# Patient Record
Sex: Female | Born: 1995 | Race: White | Hispanic: No | Marital: Single | Smoking: Never smoker
Health system: Southern US, Community
[De-identification: ages and names within clinical notes are randomized; demographics above are authoritative.]

## PROBLEM LIST (undated history)

## (undated) DIAGNOSIS — R519 Headache, unspecified: Secondary | ICD-10-CM

## (undated) DIAGNOSIS — F32A Depression, unspecified: Secondary | ICD-10-CM

## (undated) DIAGNOSIS — D649 Anemia, unspecified: Secondary | ICD-10-CM

## (undated) DIAGNOSIS — F419 Anxiety disorder, unspecified: Secondary | ICD-10-CM

## (undated) DIAGNOSIS — E041 Nontoxic single thyroid nodule: Secondary | ICD-10-CM

## (undated) HISTORY — PX: OTHER SURGICAL HISTORY: SHX169

---

## 2019-03-22 ENCOUNTER — Encounter: Payer: Self-pay | Admitting: Gastroenterology

## 2019-03-24 ENCOUNTER — Encounter: Payer: Self-pay | Admitting: Gastroenterology

## 2019-03-30 ENCOUNTER — Telehealth: Payer: Self-pay

## 2019-03-30 NOTE — Telephone Encounter (Signed)
Ref by:  Barnett Applebaum, MD  [phone]    Thyroid mass    Please review and advise

## 2019-04-01 NOTE — Telephone Encounter (Signed)
ok 

## 2019-04-01 NOTE — Telephone Encounter (Signed)
Attempt 1 of 2: No answer, message left.

## 2019-04-02 NOTE — Telephone Encounter (Signed)
Attempt 2 of 2: No answer. The patient can call [phone] when she is ready to schedule.

## 2019-10-18 ENCOUNTER — Other Ambulatory Visit: Payer: Self-pay | Admitting: Otolaryngology

## 2019-10-18 DIAGNOSIS — E049 Nontoxic goiter, unspecified: Secondary | ICD-10-CM

## 2019-11-02 ENCOUNTER — Ambulatory Visit: Payer: Self-pay

## 2019-11-08 ENCOUNTER — Other Ambulatory Visit: Payer: Self-pay | Admitting: Otolaryngology

## 2019-11-08 DIAGNOSIS — E041 Nontoxic single thyroid nodule: Secondary | ICD-10-CM

## 2019-11-10 ENCOUNTER — Other Ambulatory Visit: Payer: Self-pay

## 2019-11-10 ENCOUNTER — Ambulatory Visit
Admission: RE | Admit: 2019-11-10 | Discharge: 2019-11-10 | Disposition: A | Discharge status: Home / Self Care | Payer: BC Managed Care – PPO | Source: Ambulatory Visit | Attending: Otolaryngology | Admitting: Otolaryngology

## 2019-11-10 DIAGNOSIS — E041 Nontoxic single thyroid nodule: Secondary | ICD-10-CM | POA: Diagnosis present

## 2019-11-26 ENCOUNTER — Encounter: Payer: Self-pay | Admitting: Advanced Practice Midwife

## 2019-12-24 ENCOUNTER — Encounter
Admission: RE | Admit: 2019-12-24 | Discharge: 2019-12-24 | Disposition: A | Discharge status: Home / Self Care | Payer: BC Managed Care – PPO | Source: Ambulatory Visit | Attending: Otolaryngology | Admitting: Otolaryngology

## 2019-12-24 ENCOUNTER — Other Ambulatory Visit: Payer: BC Managed Care – PPO

## 2019-12-24 DIAGNOSIS — Z01812 Encounter for preprocedural laboratory examination: Secondary | ICD-10-CM | POA: Insufficient documentation

## 2019-12-24 HISTORY — DX: Headache, unspecified: R51.9

## 2019-12-24 HISTORY — DX: Anxiety disorder, unspecified: F41.9

## 2019-12-24 HISTORY — DX: Depression, unspecified: F32.A

## 2019-12-24 HISTORY — DX: Nontoxic single thyroid nodule: E04.1

## 2019-12-24 HISTORY — DX: Anemia, unspecified: D64.9

## 2019-12-24 NOTE — Patient Instructions (Addendum)
Your procedure is scheduled on: 12/29/19- Wednesday Report to Day Surgery on the 2nd floor of the [facility]. To find out your arrival time, please call [phone] between 1PM - 3PM on: 12/28/19- Tuesday  REMEMBER: Instructions that are not followed completely may result in serious medical risk, up to and including death; or upon the discretion of your surgeon and anesthesiologist your surgery may need to be rescheduled.  Do not eat food after midnight the night before surgery.  No gum chewing, lozengers or hard candies.  You may however, drink CLEAR liquids up to 2 hours before you are scheduled to arrive for your surgery. Do not drink anything within 2 hours of your scheduled arrival time.  Clear liquids include: - water  - apple juice without pulp - gatorade (not RED, PURPLE, OR BLUE) - black coffee or tea (Do NOT add milk or creamers to the coffee or tea) Do NOT drink anything that is not on this list.  TAKE THESE MEDICATIONS THE MORNING OF SURGERY WITH A SIP OF WATER: N/A  One week prior to surgery: Stop Anti-inflammatories (NSAIDS) such as Advil, Aleve, Ibuprofen, Motrin, Naproxen, Naprosyn and Aspirin based products such as Excedrin, Goodys Powder, BC Powder.  Stop ANY OVER THE COUNTER supplements until after surgery. (You may continue taking Tylenol, Vitamin D, Vitamin B, and multivitamin.)  No Alcohol for 24 hours before or after surgery.  No Smoking including e-cigarettes for 24 hours prior to surgery.  No chewable tobacco products for at least 6 hours prior to surgery.  No nicotine patches on the day of surgery.  Do not use any "recreational" drugs for at least a week prior to your surgery.  Please be advised that the combination of cocaine and anesthesia may have negative outcomes, up to and including death. If you test positive for cocaine, your surgery will be cancelled.  On the morning of surgery brush your teeth with toothpaste and water, you may rinse your  mouth with mouthwash if you wish. Do not swallow any toothpaste or mouthwash.  Do not wear jewelry, make-up, hairpins, clips or nail polish.  Do not wear lotions, powders, or perfumes.   Do not shave 48 hours prior to surgery.   Contact lenses, hearing aids and dentures may not be worn into surgery.  Do not bring valuables to the hospital. [facility] is not responsible for any missing/lost belongings or valuables.   Use CHG Soap or wipes as directed on instruction sheet.  Notify your doctor if there is any change in your medical condition (cold, fever, infection).  Wear comfortable clothing (specific to your surgery type) to the hospital.  Plan for stool softeners for home use; pain medications have a tendency to cause constipation. You can also help prevent constipation by eating foods high in fiber such as fruits and vegetables and drinking plenty of fluids as your diet allows.  After surgery, you can help prevent lung complications by doing breathing exercises.  Take deep breaths and cough every 1-2 hours. Your doctor may order a device called an Incentive Spirometer to help you take deep breaths. When coughing or sneezing, hold a pillow firmly against your incision with both hands. This is called "splinting." Doing this helps protect your incision. It also decreases belly discomfort.  If you are being admitted to the hospital overnight, leave your suitcase in the car. After surgery it may be brought to your room.  If you are being discharged the day of surgery, you will not  be allowed to drive home. You will need a responsible adult (18 years or older) to drive you home and stay with you that night.   If you are taking public transportation, you will need to have a responsible adult (18 years or older) with you. Please confirm with your physician that it is acceptable to use public transportation.   Please call the Pre-admissions Testing Dept. at [phone] if you have any  questions about these instructions.  Visitation Policy:  Patients undergoing a surgery or procedure may have one family member or support person with them as long as that person is not COVID-19 positive or experiencing its symptoms.  That person may remain in the waiting area during the procedure.  Inpatient Visitation Update:   In an effort to ensure the safety of our team members and our patients, we are implementing a change to our visitation policy:  Effective Monday, Aug. 9, at 7 a.m., inpatients will be allowed one support person.  o The support person may change daily.  o The support person must pass our screening, gel in and out, and wear a mask at all times, including in the patient's room.  o Patients must also wear a mask when staff or their support person are in the room.  o Masking is required regardless of vaccination status.  Systemwide, no visitors 17 or younger.

## 2019-12-27 ENCOUNTER — Other Ambulatory Visit
Admission: RE | Admit: 2019-12-27 | Discharge: 2019-12-27 | Disposition: A | Discharge status: Home / Self Care | Payer: BC Managed Care – PPO | Source: Ambulatory Visit | Attending: Otolaryngology | Admitting: Otolaryngology

## 2019-12-27 ENCOUNTER — Other Ambulatory Visit: Payer: Self-pay

## 2019-12-27 DIAGNOSIS — Z01812 Encounter for preprocedural laboratory examination: Secondary | ICD-10-CM | POA: Diagnosis not present

## 2019-12-27 DIAGNOSIS — Z20822 Contact with and (suspected) exposure to covid-19: Secondary | ICD-10-CM | POA: Diagnosis not present

## 2019-12-28 LAB — SARS CORONAVIRUS 2 (TAT 6-24 HRS): SARS Coronavirus 2: NEGATIVE

## 2019-12-28 MED ORDER — ORAL CARE MOUTH RINSE: 15.0000 mL | Freq: Once | OROMUCOSAL | Status: AC

## 2019-12-28 MED ORDER — CHLORHEXIDINE GLUCONATE 0.12 % MT SOLN: 15.0000 mL | Freq: Once | OROMUCOSAL | Status: AC

## 2019-12-28 MED ORDER — LACTATED RINGERS IV SOLN: INTRAVENOUS | Status: DC

## 2019-12-28 MED ORDER — FAMOTIDINE 20 MG PO TABS: 20.0000 mg | ORAL_TABLET | Freq: Once | ORAL | Status: AC

## 2019-12-29 ENCOUNTER — Ambulatory Visit: Payer: BC Managed Care – PPO

## 2019-12-29 ENCOUNTER — Observation Stay
Admission: RE | Admit: 2019-12-29 | Discharge: 2019-12-29 | Disposition: A | Discharge status: Home / Self Care | Payer: BC Managed Care – PPO | Attending: Otolaryngology | Admitting: Otolaryngology

## 2019-12-29 ENCOUNTER — Other Ambulatory Visit: Payer: Self-pay

## 2019-12-29 ENCOUNTER — Encounter: Payer: Self-pay | Admitting: Otolaryngology

## 2019-12-29 ENCOUNTER — Encounter
Admission: RE | Disposition: A | Discharge status: Home / Self Care | Payer: Self-pay | Source: Home / Self Care | Attending: Otolaryngology

## 2019-12-29 DIAGNOSIS — E890 Postprocedural hypothyroidism: Secondary | ICD-10-CM

## 2019-12-29 DIAGNOSIS — E041 Nontoxic single thyroid nodule: Principal | ICD-10-CM | POA: Insufficient documentation

## 2019-12-29 HISTORY — PX: THYROIDECTOMY: SHX17

## 2019-12-29 LAB — POCT PREGNANCY, URINE: Preg Test, Ur: NEGATIVE

## 2019-12-29 SURGERY — THYROIDECTOMY
Anesthesia: General | Site: Neck | Laterality: Right

## 2019-12-29 MED ORDER — ONDANSETRON 4 MG PO TBDP: 4.0000 mg | ORAL_TABLET | Freq: Four times a day (QID) | ORAL | Status: DC | PRN

## 2019-12-29 MED ORDER — FENTANYL CITRATE (PF) 100 MCG/2ML IJ SOLN
INTRAMUSCULAR | Status: AC
  Filled 2019-12-29: qty 2

## 2019-12-29 MED ORDER — SUCCINYLCHOLINE CHLORIDE 20 MG/ML IJ SOLN
INTRAMUSCULAR | Status: DC | PRN
  Administered 2019-12-29: 100 mg via INTRAVENOUS

## 2019-12-29 MED ORDER — SEVOFLURANE IN SOLN
RESPIRATORY_TRACT | Status: AC
  Filled 2019-12-29: qty 250

## 2019-12-29 MED ORDER — FLEET ENEMA 7-19 GM/118ML RE ENEM: 1.0000 | ENEMA | Freq: Once | RECTAL | Status: DC | PRN

## 2019-12-29 MED ORDER — SCOPOLAMINE 1 MG/3DAYS TD PT72: 1.0000 | MEDICATED_PATCH | TRANSDERMAL | Status: DC

## 2019-12-29 MED ORDER — CHLORHEXIDINE GLUCONATE 0.12 % MT SOLN
OROMUCOSAL | Status: AC
  Administered 2019-12-29: 15 mL via OROMUCOSAL
  Filled 2019-12-29: qty 15

## 2019-12-29 MED ORDER — DEXAMETHASONE SODIUM PHOSPHATE 10 MG/ML IJ SOLN
INTRAMUSCULAR | Status: DC | PRN
  Administered 2019-12-29: 10 mg via INTRAVENOUS

## 2019-12-29 MED ORDER — DEXMEDETOMIDINE (PRECEDEX) IN NS 20 MCG/5ML (4 MCG/ML) IV SYRINGE
PREFILLED_SYRINGE | INTRAVENOUS | Status: DC | PRN
  Administered 2019-12-29: 8 ug via INTRAVENOUS
  Administered 2019-12-29 (×3): 4 ug via INTRAVENOUS

## 2019-12-29 MED ORDER — SODIUM CHLORIDE 0.9 % IV SOLN
INTRAVENOUS | Status: DC | PRN
  Administered 2019-12-29: 30 ug/min via INTRAVENOUS

## 2019-12-29 MED ORDER — SUCCINYLCHOLINE CHLORIDE 200 MG/10ML IV SOSY
PREFILLED_SYRINGE | INTRAVENOUS | Status: AC
  Filled 2019-12-29: qty 10

## 2019-12-29 MED ORDER — DEXAMETHASONE SODIUM PHOSPHATE 10 MG/ML IJ SOLN
INTRAMUSCULAR | Status: AC
  Filled 2019-12-29: qty 1

## 2019-12-29 MED ORDER — ROCURONIUM BROMIDE 100 MG/10ML IV SOLN
INTRAVENOUS | Status: DC | PRN
  Administered 2019-12-29: 5 mg via INTRAVENOUS

## 2019-12-29 MED ORDER — FENTANYL CITRATE (PF) 100 MCG/2ML IJ SOLN
INTRAMUSCULAR | Status: DC | PRN
  Administered 2019-12-29 (×3): 50 ug via INTRAVENOUS

## 2019-12-29 MED ORDER — KETAMINE HCL 50 MG/ML IJ SOLN
INTRAMUSCULAR | Status: AC
  Filled 2019-12-29: qty 10

## 2019-12-29 MED ORDER — PROPOFOL 10 MG/ML IV BOLUS
INTRAVENOUS | Status: AC
  Filled 2019-12-29: qty 40

## 2019-12-29 MED ORDER — LIDOCAINE-EPINEPHRINE (PF) 1 %-1:200000 IJ SOLN
INTRAMUSCULAR | Status: DC | PRN
  Administered 2019-12-29: 6 mL

## 2019-12-29 MED ORDER — LIDOCAINE HCL (CARDIAC) PF 100 MG/5ML IV SOSY
PREFILLED_SYRINGE | INTRAVENOUS | Status: DC | PRN
  Administered 2019-12-29: 20 mg via INTRAVENOUS
  Administered 2019-12-29: 80 mg via INTRAVENOUS

## 2019-12-29 MED ORDER — ONDANSETRON HCL 4 MG/2ML IJ SOLN: 4.0000 mg | Freq: Once | INTRAMUSCULAR | Status: DC | PRN

## 2019-12-29 MED ORDER — HYDROCODONE-ACETAMINOPHEN 5-325 MG PO TABS
1.0000 | ORAL_TABLET | ORAL | 0 refills | Status: AC | PRN
Start: 2019-12-29 — End: 2020-01-05

## 2019-12-29 MED ORDER — BISACODYL 5 MG PO TBEC
5.0000 mg | DELAYED_RELEASE_TABLET | Freq: Every day | ORAL | Status: DC | PRN
  Filled 2019-12-29: qty 1

## 2019-12-29 MED ORDER — KETAMINE HCL 10 MG/ML IJ SOLN
INTRAMUSCULAR | Status: DC | PRN
  Administered 2019-12-29: 30 mg via INTRAVENOUS

## 2019-12-29 MED ORDER — HYDROCODONE-ACETAMINOPHEN 5-325 MG PO TABS: 1.0000 | ORAL_TABLET | ORAL | Status: DC | PRN

## 2019-12-29 MED ORDER — FENTANYL CITRATE (PF) 100 MCG/2ML IJ SOLN
INTRAMUSCULAR | Status: AC
  Administered 2019-12-29: 25 ug via INTRAVENOUS
  Filled 2019-12-29: qty 2

## 2019-12-29 MED ORDER — PROPOFOL 10 MG/ML IV BOLUS
INTRAVENOUS | Status: DC | PRN
  Administered 2019-12-29: 150 mg via INTRAVENOUS
  Administered 2019-12-29: 30 mg via INTRAVENOUS

## 2019-12-29 MED ORDER — LIDOCAINE HCL (PF) 2 % IJ SOLN
INTRAMUSCULAR | Status: AC
  Filled 2019-12-29: qty 5

## 2019-12-29 MED ORDER — PHENYLEPHRINE HCL (PRESSORS) 10 MG/ML IV SOLN
INTRAVENOUS | Status: AC
  Filled 2019-12-29: qty 1

## 2019-12-29 MED ORDER — ONDANSETRON HCL 4 MG/2ML IJ SOLN
INTRAMUSCULAR | Status: AC
  Filled 2019-12-29: qty 2

## 2019-12-29 MED ORDER — MAGNESIUM HYDROXIDE 400 MG/5ML PO SUSP
30.0000 mL | Freq: Every day | ORAL | Status: DC | PRN
  Filled 2019-12-29: qty 30

## 2019-12-29 MED ORDER — MIDAZOLAM HCL 2 MG/2ML IJ SOLN
INTRAMUSCULAR | Status: AC
  Filled 2019-12-29: qty 4

## 2019-12-29 MED ORDER — SCOPOLAMINE 1 MG/3DAYS TD PT72
MEDICATED_PATCH | TRANSDERMAL | Status: AC
  Administered 2019-12-29: 1.5 mg via TRANSDERMAL
  Filled 2019-12-29: qty 1

## 2019-12-29 MED ORDER — FENTANYL CITRATE (PF) 100 MCG/2ML IJ SOLN
25.0000 ug | INTRAMUSCULAR | Status: DC | PRN
  Administered 2019-12-29 (×3): 25 ug via INTRAVENOUS

## 2019-12-29 MED ORDER — DEXMEDETOMIDINE (PRECEDEX) IN NS 20 MCG/5ML (4 MCG/ML) IV SYRINGE
PREFILLED_SYRINGE | INTRAVENOUS | Status: AC
  Filled 2019-12-29: qty 5

## 2019-12-29 MED ORDER — BACITRACIN 500 UNIT/GM EX OINT
TOPICAL_OINTMENT | CUTANEOUS | Status: DC | PRN
  Administered 2019-12-29: 1 via TOPICAL

## 2019-12-29 MED ORDER — DEXTROSE-NACL 5-0.45 % IV SOLN: INTRAVENOUS | Status: DC

## 2019-12-29 MED ORDER — FAMOTIDINE 20 MG PO TABS
ORAL_TABLET | ORAL | Status: AC
  Administered 2019-12-29: 20 mg via ORAL
  Filled 2019-12-29: qty 1

## 2019-12-29 MED ORDER — MIDAZOLAM HCL 2 MG/2ML IJ SOLN
INTRAMUSCULAR | Status: DC | PRN
  Administered 2019-12-29: 1 mg via INTRAVENOUS
  Administered 2019-12-29: 3 mg via INTRAVENOUS

## 2019-12-29 MED ORDER — MORPHINE SULFATE (PF) 4 MG/ML IV SOLN: 3.0000 mg | INTRAVENOUS | Status: DC | PRN

## 2019-12-29 MED ORDER — PHENYLEPHRINE HCL (PRESSORS) 10 MG/ML IV SOLN
INTRAVENOUS | Status: DC | PRN
  Administered 2019-12-29: 50 ug via INTRAVENOUS
  Administered 2019-12-29 (×4): 100 ug via INTRAVENOUS

## 2019-12-29 MED ORDER — HYDROCODONE-ACETAMINOPHEN 5-325 MG PO TABS
ORAL_TABLET | ORAL | Status: AC
  Administered 2019-12-29: 2 via ORAL
  Filled 2019-12-29: qty 2

## 2019-12-29 MED ORDER — ONDANSETRON HCL 4 MG/2ML IJ SOLN
INTRAMUSCULAR | Status: DC | PRN
  Administered 2019-12-29: 4 mg via INTRAVENOUS

## 2019-12-29 MED ORDER — ONDANSETRON HCL 4 MG/2ML IJ SOLN: 4.0000 mg | Freq: Four times a day (QID) | INTRAMUSCULAR | Status: DC | PRN

## 2019-12-29 SURGICAL SUPPLY — 44 items
BLADE SURG 15 STRL LF DISP TIS (BLADE) ×2 IMPLANT
BLADE SURG 15 STRL SS (BLADE) ×2
BULB RESERV EVAC DRAIN JP 100C (MISCELLANEOUS) IMPLANT
CANISTER SUCT 1200ML W/VALVE (MISCELLANEOUS) ×2 IMPLANT
CORD BIP STRL DISP 12FT (MISCELLANEOUS) ×2 IMPLANT
COVER WAND RF STERILE (DRAPES) ×2 IMPLANT
DRAIN TLS ROUND 10FR (DRAIN) IMPLANT
DRAPE MAG INST 16X20 L/F (DRAPES) ×2 IMPLANT
DRSG TEGADERM 2-3/8X2-3/4 SM (GAUZE/BANDAGES/DRESSINGS) ×2 IMPLANT
DRSG TEGADERM 4X4.75 (GAUZE/BANDAGES/DRESSINGS) ×2 IMPLANT
DRSG TELFA 3X8 NADH (GAUZE/BANDAGES/DRESSINGS) ×2 IMPLANT
ELECT LARYNGEAL 6/7 (MISCELLANEOUS)
ELECT LARYNGEAL 8/9 (MISCELLANEOUS)
ELECT NEEDLE 20X.3 GREEN (MISCELLANEOUS) ×2
ELECT REM PT RETURN 9FT ADLT (ELECTROSURGICAL) ×2
ELECTRODE LARYNGEAL 6/7 (MISCELLANEOUS) IMPLANT
ELECTRODE LARYNGEAL 8/9 (MISCELLANEOUS) IMPLANT
ELECTRODE NDL 20X.3 GREEN (MISCELLANEOUS) IMPLANT
ELECTRODE NEEDLE 20X.3 GREEN (MISCELLANEOUS) ×1 IMPLANT
ELECTRODE REM PT RTRN 9FT ADLT (ELECTROSURGICAL) ×1 IMPLANT
FORCEPS JEWEL BIP 4-3/4 STR (INSTRUMENTS) ×2 IMPLANT
GAUZE 4X4 16PLY RFD (DISPOSABLE) ×2 IMPLANT
GLOVE BIO SURGEON STRL SZ7.5 (GLOVE) ×2 IMPLANT
GLOVE PROTEXIS LATEX SZ 7.5 (GLOVE) ×2 IMPLANT
GLOVE SURG LATEX 7.5 PF (GLOVE) ×1 IMPLANT
GOWN STRL REUS W/ TWL LRG LVL3 (GOWN DISPOSABLE) ×3 IMPLANT
GOWN STRL REUS W/TWL LRG LVL3 (GOWN DISPOSABLE) ×3
HEMOSTAT SURGICEL 2X3 (HEMOSTASIS) ×2 IMPLANT
HOOK STAY BLUNT/RETRACTOR 5M (MISCELLANEOUS) ×2 IMPLANT
LABEL OR SOLS (LABEL) ×2 IMPLANT
NS IRRIG 500ML POUR BTL (IV SOLUTION) ×2 IMPLANT
PACK HEAD/NECK (MISCELLANEOUS) ×2 IMPLANT
PAD DRESSING TELFA 3X8 NADH (GAUZE/BANDAGES/DRESSINGS) ×1 IMPLANT
PROBE NEUROSIGN BIPOL (MISCELLANEOUS) ×1 IMPLANT
PROBE NEUROSIGN BIPOLAR (MISCELLANEOUS) ×1
SHEARS HARMONIC 9CM CVD (BLADE) ×2 IMPLANT
SPONGE KITTNER 5P (MISCELLANEOUS) ×2 IMPLANT
STRAP SAFETY 5IN WIDE (MISCELLANEOUS) ×2 IMPLANT
SUT ETHILON 6 0 9-3 1X18 BLK (SUTURE) IMPLANT
SUT PROLENE 6 0 P 1 18 (SUTURE) ×2 IMPLANT
SUT SILK 2 0 (SUTURE) ×1
SUT SILK 2-0 18XBRD TIE 12 (SUTURE) ×1 IMPLANT
SUT VIC AB 4-0 RB1 18 (SUTURE) ×3 IMPLANT
SYSTEM CHEST DRAIN TLS 7FR (DRAIN) ×1 IMPLANT

## 2019-12-29 NOTE — Anesthesia Procedure Notes (Signed)
Procedure Name: Intubation Date/Time: 12/29/2019 8:43 AM Performed by: Elmarie Mainland, CRNA Pre-anesthesia Checklist: Patient identified, Emergency Drugs available, Suction available and Patient being monitored Patient Re-evaluated:Patient Re-evaluated prior to induction Oxygen Delivery Method: Circle system utilized Preoxygenation: Pre-oxygenation with 100% oxygen Induction Type: IV induction Ventilation: Mask ventilation without difficulty Laryngoscope Size: McGraph and 3 Grade View: Grade I Tube type: Oral (with neuro monitor device) Number of attempts: 1 Airway Equipment and Method: Stylet and Video-laryngoscopy Placement Confirmation: ETT inserted through vocal cords under direct vision,  positive ETCO2 and breath sounds checked- equal and bilateral Secured at: 21 cm Tube secured with: Tape Dental Injury: Teeth and Oropharynx as per pre-operative assessment

## 2019-12-29 NOTE — Anesthesia Preprocedure Evaluation (Signed)
Anesthesia Evaluation  Patient identified by MRN, date of birth, ID band Patient awake    Reviewed: Allergy & Precautions, NPO status , Patient's Chart, lab work & pertinent test results  Airway Mallampati: II  TM Distance: >3 FB     Dental  (+) Teeth Intact   Pulmonary neg pulmonary ROS,    Pulmonary exam normal        Cardiovascular negative cardio ROS Normal cardiovascular exam     Neuro/Psych  Headaches, PSYCHIATRIC DISORDERS Anxiety Depression    GI/Hepatic negative GI ROS, Neg liver ROS,   Endo/Other  negative endocrine ROS  Renal/GU negative Renal ROS  negative genitourinary   Musculoskeletal negative musculoskeletal ROS (+)   Abdominal Normal abdominal exam  (+)   Peds negative pediatric ROS (+)  Hematology  (+) anemia ,   Anesthesia Other Findings Past Medical History: No date: Anemia No date: Anxiety No date: Depression No date: Headache No date: Thyroid cyst  Reproductive/Obstetrics                             Anesthesia Physical Anesthesia Plan  ASA: II  Anesthesia Plan: General   Post-op Pain Management:    Induction: Intravenous  PONV Risk Score and Plan:   Airway Management Planned: Oral ETT  Additional Equipment:   Intra-op Plan:   Post-operative Plan: Extubation in OR  Informed Consent: I have reviewed the patients History and Physical, chart, labs and discussed the procedure including the risks, benefits and alternatives for the proposed anesthesia with the patient or authorized representative who has indicated his/her understanding and acceptance.     Dental advisory given  Plan Discussed with: CRNA and Surgeon  Anesthesia Plan Comments:         Anesthesia Quick Evaluation

## 2019-12-29 NOTE — Progress Notes (Signed)
Discharge instructions given to patient at bedside. Information explained to patient and patient verbalized understanding. No further questions or concerns at this time.   Luvenia Starch, RN

## 2019-12-29 NOTE — Transfer of Care (Signed)
Immediate Anesthesia Transfer of Care Note  Patient: Cynthia [name]  Procedure(s) Performed: RIGHT THYROIDECTOMY (Right Neck)  Patient Location: PACU  Anesthesia Type:General  Level of Consciousness: drowsy  Airway & Oxygen Therapy: Patient Spontanous Breathing and Patient connected to face mask oxygen  Post-op Assessment: Report given to RN and Post -op Vital signs reviewed and stable  Post vital signs: Reviewed and stable  Last Vitals:  Vitals Value Taken Time  BP 111/62 12/29/19 1048  Temp 36.4 C 12/29/19 1048  Pulse 72 12/29/19 1051  Resp 13 12/29/19 1051  SpO2 100 % 12/29/19 1051  Vitals shown include unvalidated device data.  Last Pain:  Vitals:   12/29/19 1048  TempSrc:   PainSc: Asleep         Complications: No complications documented.

## 2019-12-29 NOTE — Progress Notes (Signed)
Patient has been advanced from soft foods to regular diet per order and tolerating well. Patient assisted with walking and performed well. Up as tolerated. Drain output currently at 2 cc's. MD notified.   Luvenia Starch, RN

## 2019-12-29 NOTE — Op Note (Signed)
12/29/2019  10:43 AM    Cynthia [name]  [id number]   Pre-Op Dx: Massive right thyroid cyst  Post-op Dx: Same  Proc: Right hemithyroidectomy with removal of thyroid cyst  Surg:  Cammy Copa     assessed: Cynthia [name]  Anes:  GOT  EBL: 50 mL  Comp: None  Findings: Very large cyst approximately 8 cm in size that was attached to the right thyroid gland.  The right recurrent laryngeal nerve was found and stimulated well at the end of the case.  The right superior parathyroid gland was found and spared as well.  Procedure: Patient was brought to the operating room and placed in the supine position.  The patient was given general anesthesia by oral endotracheal intubation.  The endotracheal tube was wrapped with electrodes to continuously monitor the recurrent laryngeal nerves during the procedure.  The neck had swollen area on the right side.  There was a scar on the anterior neck where a small biopsy of the skin was done previously.  The neck was marked with the curvilinear incision that was around the previous scar to remove the scar.  7 mL of 1% Xylocaine with epi 1-100,000 was used for infiltration of the subcu tissues.  The patient was then prepped and draped in a sterile fashion.  The skin was then excised creating a smile shaped incision that envelope the scar tissue that was in the right anterior neck.  The skin was then elevated superiorly and inferiorly.  The platysma muscle was then cut across and bleeding was controlled with electrocautery.  The strap muscles were divided in the midline and elevated over the large cyst.  The cyst extended across the midline but was not attached to the left thyroid gland.  The cyst was freed up on the left side first to separated from the tissues and the trachea was palpable.  The attachment of the trachea were left for the time being.  The attachments inferiorly and laterally were freed up from the cyst.  It was scarred to the right of midline  where previous aspirations of the cyst were done.  The cyst was still intact at the end of the procedure.  The cyst was freed up around the superior right pole and the superior portion of the thyroid gland was found.  This was freed up from its attachments on both sides and the superior thyroid vessels were cut across with the harmonic scalpel.  A superior parathyroid gland was found in the left buried in the superior tissues.  Once the superior pole of the thyroid gland was freed up it was pulled medially.  The large cyst was able to be delivered through the wound so that the entire gland was rotated from lateral to medial along with the cyst.  The recurrent laryngeal nerve was found and stimulated well.  It was tracked up and the fascia of Berry's ligament was cut across to free up the gland from the trachea and expose the nerve as it turned and went into the larynx.  The nerve was spared as the remaining attachments of the gland to the trachea were freed up.  There is a very small isthmus that was cut across.  Once the gland and cyst was completely removed it was sent for permanent section.  The nerve was restimulated and stimulated well.  The wound was irrigated and there was no significant bleeding at all.  It was a very dry wound.  Some Surgicel was placed  over the nerve bed and a 7 French TLS drain was placed.  The drain was placed to Vacutainer suction.  The wound was then closed.  4-0 Vicryl's were used to bring the strap muscle to the midline and close them loosely.  The platysma muscle was then closed with interrupted 4-0 Vicryl sutures.  The skin edges were elevated on both sides for advancement closure.  The dermis was then closed with 4-0 interrupted Vicryls and the skin edges were held in apposition with a running locking 6-0 Prolene suture.  The wound was then covered with a small amount of ointment followed by Telfa and a Tegaderm dressing.  The drains were taped in with 2 inch paper tape.  The  patient tolerated the procedure well.  She was awakened and taken to recovery room in satisfactory condition.  There were no operative complications.  Dispo:   To PACU to the observed for the afternoon and evening, and possibly overnight.  Plan: We will check this evening and if she is doing well we can pull the drain and discharge her home tonight.  Beverly Sessions Stori Royse  12/29/2019 10:43 AM

## 2019-12-29 NOTE — Progress Notes (Signed)
Received report from [place], RN at bedside. Patient vitals stable. Up as tolerated. Will continue to monitor.  Luvenia Starch, RN

## 2019-12-29 NOTE — Progress Notes (Signed)
12/29/2019 6:12 PM  [name], [name] [id number]  Post-Op Check    Temp:  [97.2 F (36.2 C)-98.5 F (36.9 C)] 98.4 F (36.9 C) (11/03 1703) Pulse Rate:  [70-102] 73 (11/03 1703) Resp:  [12-27] 20 (11/03 1703) BP: (99-125)/(62-79) 115/79 (11/03 1703) SpO2:  [98 %-100 %] 100 % (11/03 1703) Weight:  [77.1 kg] 77.1 kg (11/03 0720),     Intake/Output Summary (Last 24 hours) at 12/29/2019 1812 Last data filed at 12/29/2019 1500 Gross per 24 hour  Intake 1782.05 ml  Output 22 ml  Net 1760.05 ml    Results for orders placed or performed during the hospital encounter of 12/29/19 (from the past 24 hour(s))  Pregnancy, urine POC     Status: None   Collection Time: 12/29/19  7:40 AM  Result Value Ref Range   Preg Test, Ur NEGATIVE NEGATIVE    SUBJECTIVE: She is feeling very well with no significant pain at all.  She has not needed any pain medication thus far.  She is eating well without any pain.  Her voice is clear.  OBJECTIVE: Her wound is flat and the dressing is completely dry.  She is only had 2 mL of drainage out for the entire afternoon.  I remove the drain now.  Her dressing is intact.  IMPRESSION: She is about 7 hours postop right hemithyroidectomy with removal of a large cyst.  She is not having any problems and seems to be ready for discharge.  Her pain is well controlled.  She is not had any bleeding and her dressing is dry.  PLAN: I have written for her discharge now.  We will send some Vicodin to her pharmacy that she can use for pain at home and when is not bothering her much can use Tylenol or ibuprofen in a couple of days.  We will follow up in the office in 1 week for removal of sutures and to go over the path report.  She will rest at home with her head elevated some for the next couple of days.  Cynthia [name] 12/29/2019, 6:12 PM

## 2019-12-29 NOTE — H&P (Signed)
H&P has been reviewed and patient reevaluated, no changes necessary. To be downloaded later.  

## 2019-12-30 ENCOUNTER — Encounter: Payer: Self-pay | Admitting: Otolaryngology

## 2019-12-30 LAB — SURGICAL PATHOLOGY

## 2019-12-30 NOTE — Anesthesia Postprocedure Evaluation (Signed)
Anesthesia Post Note  Patient: Cynthia [name]  Procedure(s) Performed: RIGHT THYROIDECTOMY (Right Neck)  Patient location during evaluation: Endoscopy Anesthesia Type: General Level of consciousness: awake and alert and oriented Pain management: pain level controlled Vital Signs Assessment: post-procedure vital signs reviewed and stable Respiratory status: spontaneous breathing Cardiovascular status: blood pressure returned to baseline Anesthetic complications: no   No complications documented.   Last Vitals:  Vitals:   12/29/19 1703 12/29/19 1704  BP: 115/79 115/79  Pulse: 73 67  Resp: 20   Temp: 36.9 C 36.9 C  SpO2: 100% 100%    Last Pain:  Vitals:   12/29/19 1704  TempSrc: Oral  PainSc:                  [name]

## 2020-01-06 NOTE — Discharge Summary (Signed)
Physician Discharge Summary  Patient ID: Cynthia [name] MRN: [id number] DOB/AGE: 24-14-1997 24 y.o.  Admit date: 12/29/2019 Discharge date: 01/06/2020  Admission Diagnoses: Right thyroid cyst  Discharge Diagnoses: Same Active Problems:   S/P partial thyroidectomy   Discharged Condition: Good  Hospital Course: Had right thyroidectomy. Drains with almost no drainage, and removed 6 hours postop. Voice clear. Eating well. D/C'd home.  Consults: None  Significant Diagnostic Studies: None  Treatments: Right hemithyroidectomy  Discharge Exam: Blood pressure 115/79, pulse 67, temperature 98.4 F (36.9 C), temperature source Oral, resp. rate 20, height 5\' 7"  (1.702 m), weight 77.1 kg, last menstrual period 12/15/2019, SpO2 100 %. Wound flat and dressing dry.  Disposition: Discharge disposition: 01-Home or Self Care       Discharge Instructions    Call MD for:  hives   Complete by: As directed    Call MD for:  persistant nausea and vomiting   Complete by: As directed    Call MD for:  severe uncontrolled pain   Complete by: As directed    Diet general   Complete by: As directed    Discharge wound care:   Complete by: As directed    Keep dressing on wound until seen in office. May changer at home if she wishes.   Increase activity slowly   Complete by: As directed      Allergies as of 12/29/2019   No Known Allergies     Medication List    TAKE these medications   drospirenone-ethinyl estradiol 3-0.03 MG tablet Commonly known as: YASMIN Take 1 tablet by mouth daily.     ASK your doctor about these medications   HYDROcodone-acetaminophen 5-325 MG tablet Commonly known as: NORCO/VICODIN Take 1-2 tablets by mouth every 4 (four) hours as needed for up to 7 days for moderate pain. Ask about: Should I take this medication?            Discharge Care Instructions  (From admission, onward)         Start     Ordered   12/29/19 0000  Discharge wound care:        Comments: Keep dressing on wound until seen in office. May changer at home if she wishes.   12/29/19 1807          Follow-up Information    13/03/21, MD In 1 week.   Specialty: Otolaryngology Contact information: [address] [place] [phone]               Signed: [phone] 01/06/2020, 7:28 AM

## 2020-04-10 ENCOUNTER — Other Ambulatory Visit (HOSPITAL_COMMUNITY): Payer: Self-pay | Admitting: Otolaryngology

## 2022-08-14 IMAGING — US US THYROID
1 series · 13 of 25 positions shown · non-contrast
Comparison: None.

CLINICAL DATA: Thyroid nodule on exam

EXAM:
THYROID ULTRASOUND
TECHNIQUE: Ultrasound examination of the thyroid gland and adjacent soft
tissues was performed.

[Series 1: us thyroid · 0.07mm/px · 13 of 74 slices shown]
[im 1/74]
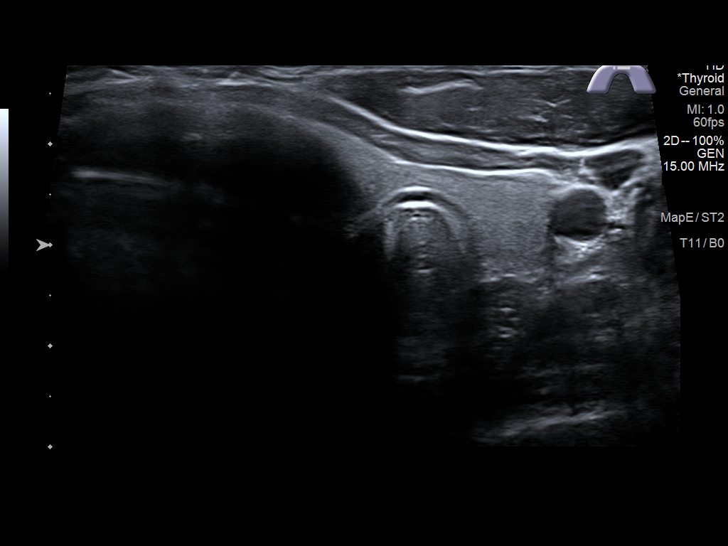
[im 7/74]
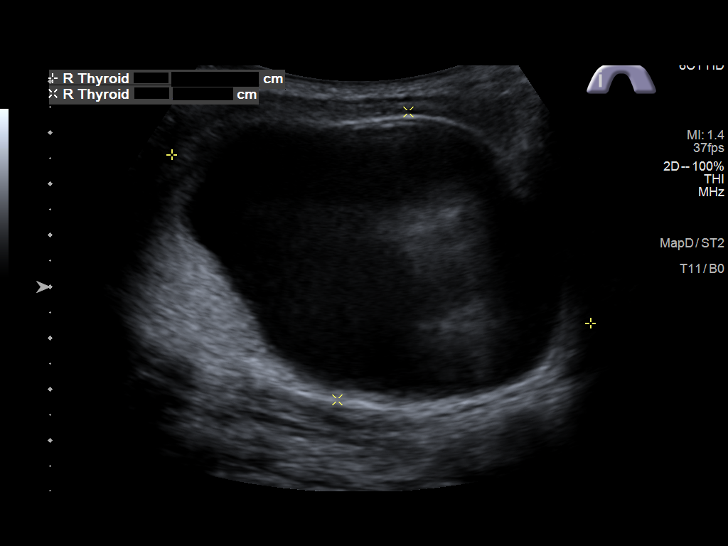
[im 13/74]
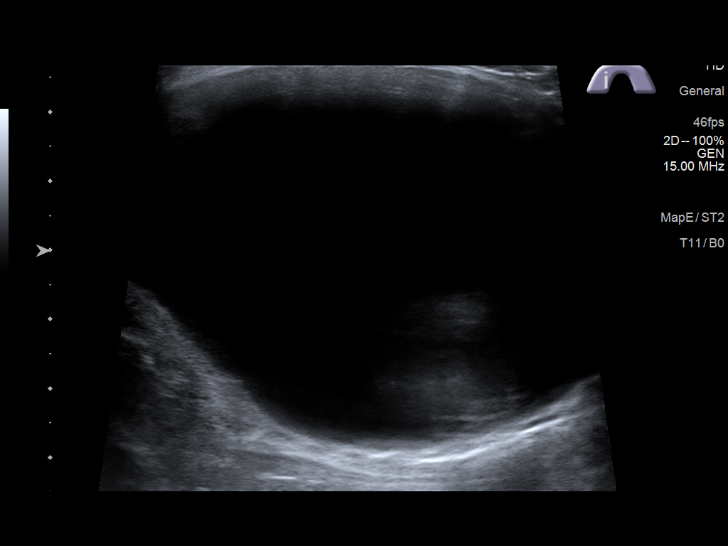
[im 19/74]
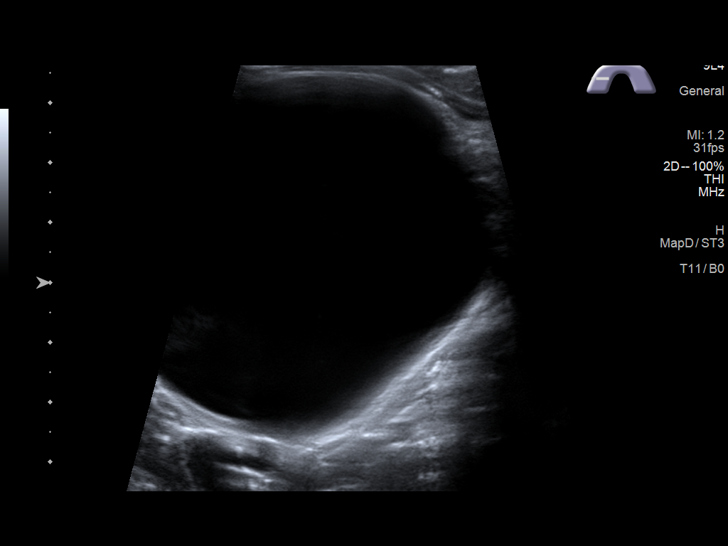
[im 25/74]
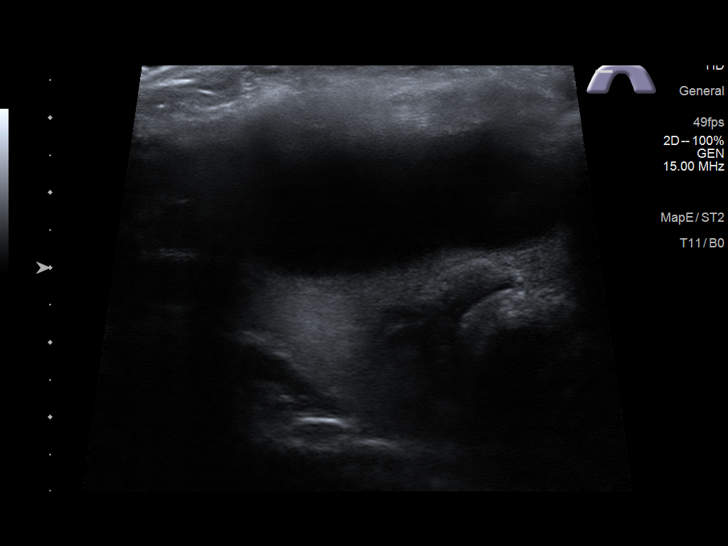
[im 31/74]
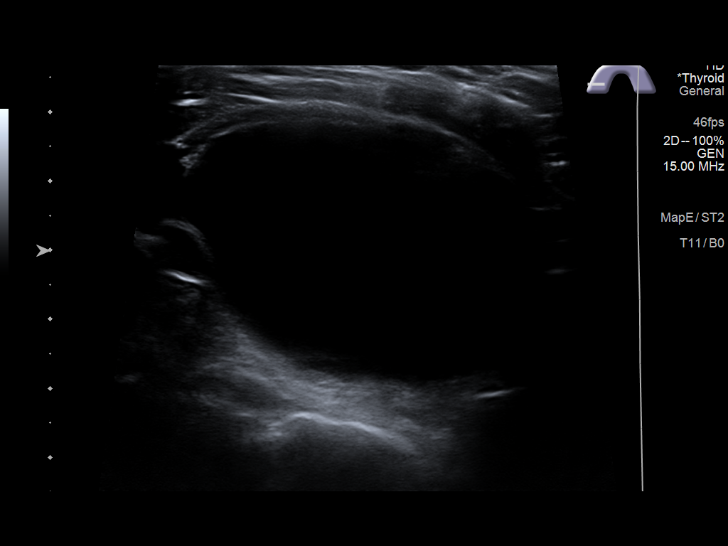
[im 37/74]
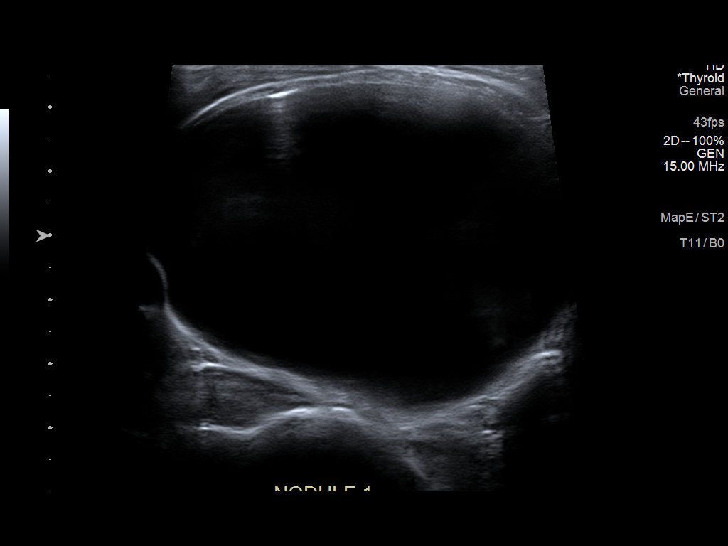
[im 43/74]
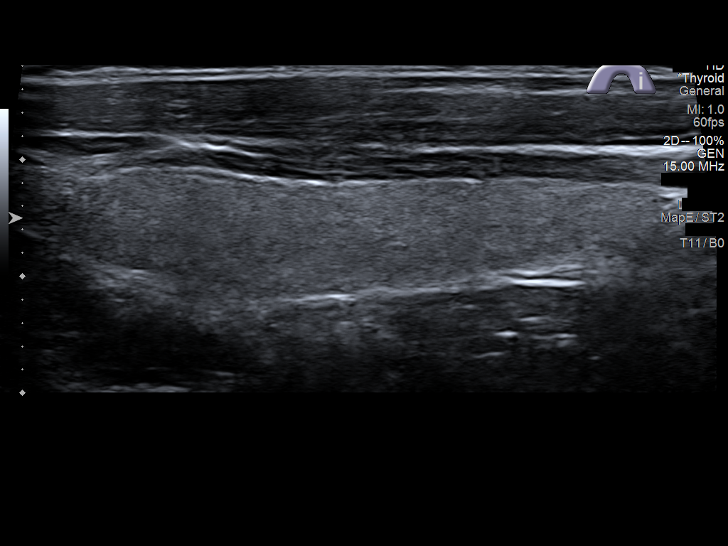
[im 49/74]
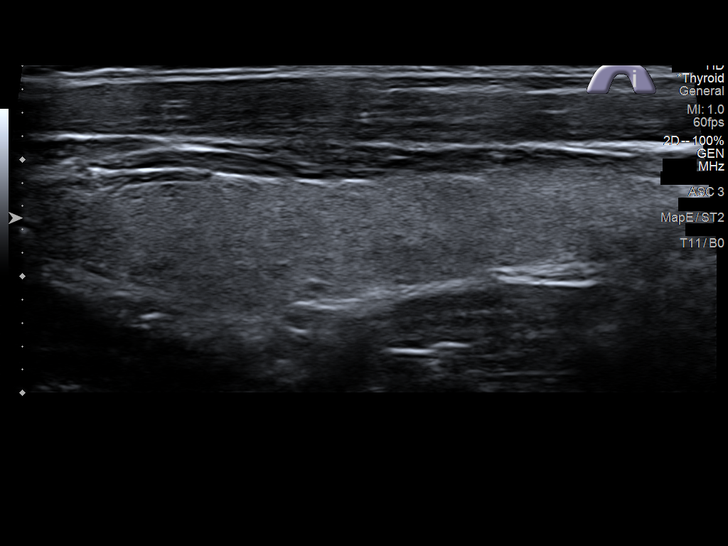
[im 55/74]
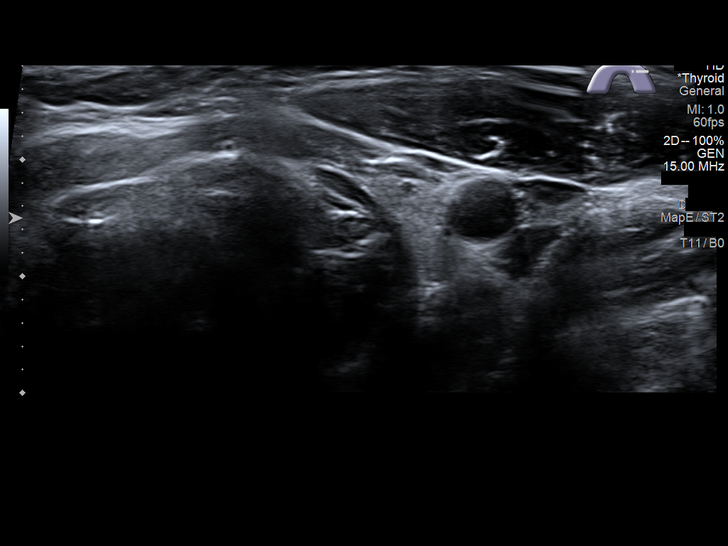
[im 61/74]
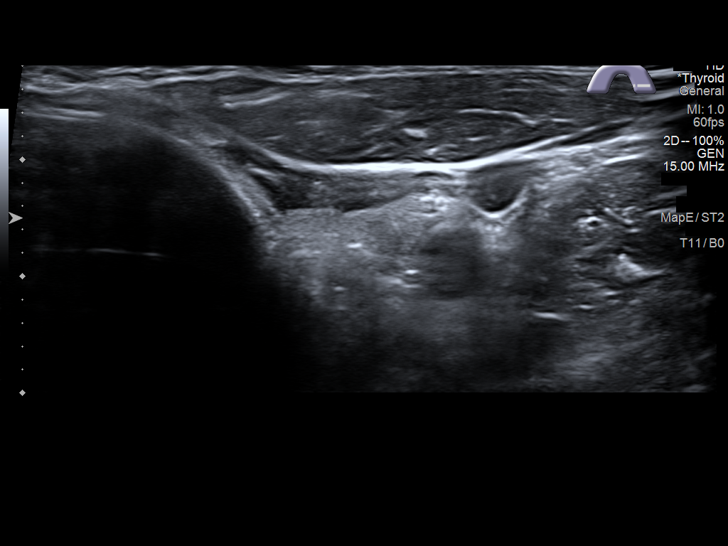
[im 67/74]
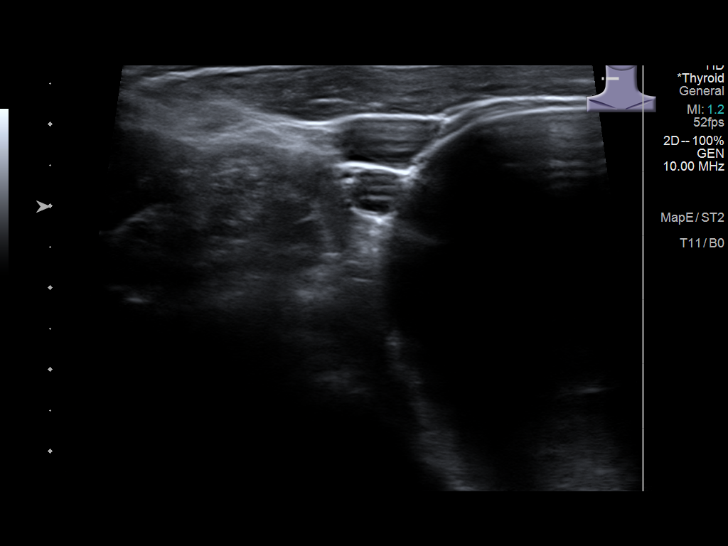
[im 74/74]
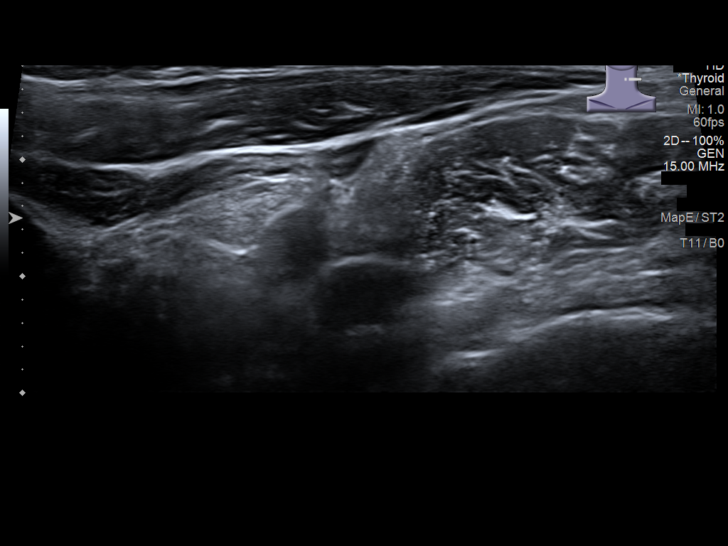

[13 of 25 positions shown; findings below may reference images not displayed]

FINDINGS: Parenchymal Echotexture: Normal

Isthmus: 3 mm

Right lobe: 8.8 x 5.8 x 6.8 cm

Left lobe: 5.2 x 1.0 x 1.0 cm

_________________________________________________________

Estimated total number of nodules >/= 1 cm: 1

Number of spongiform nodules >/=  2 cm not described below (TR1): 0

Number of mixed cystic and solid nodules >/= 1.5 cm not described
below (TR2): 0

_________________________________________________________

Nodule # 1:

Location: Right; Mid

Maximum size: 8.6 cm; Other 2 dimensions: 7.2 x 4.9 cm

Composition: cystic/almost completely cystic (0)

Echogenicity: anechoic (0)

Shape: not taller-than-wide (0)

Margins: smooth (0)

Echogenic foci: none (0)

ACR TI-RADS total points: 0.

ACR TI-RADS risk category: TR1 (0-1 points).

ACR TI-RADS recommendations:

This nodule does NOT meet TI-RADS criteria for biopsy or dedicated
follow-up.

_________________________________________________________

No significant left thyroid abnormality. Normal vascularity. No
regional adenopathy.
IMPRESSION: 8.6 cm large right thyroid TR 1 cystic nodule does not meet criteria
for biopsy or follow-up. However, if this results in symptoms such
as fullness or dysphagia, the cystic nodule is amenable to
ultrasound aspiration.

The above is in keeping with the ACR TI-RADS recommendations - [HOSPITAL] 9030;[DATE].
